# Patient Record
Sex: Male | Born: 1985 | Race: White | Hispanic: No | Marital: Single | Smoking: Never smoker
Health system: Southern US, Community
[De-identification: ages and names within clinical notes are randomized; demographics above are authoritative.]

## PROBLEM LIST (undated history)

## (undated) DIAGNOSIS — R0902 Hypoxemia: Secondary | ICD-10-CM

## (undated) DIAGNOSIS — G4730 Sleep apnea, unspecified: Secondary | ICD-10-CM

## (undated) DIAGNOSIS — F32A Depression, unspecified: Secondary | ICD-10-CM

## (undated) DIAGNOSIS — N289 Disorder of kidney and ureter, unspecified: Secondary | ICD-10-CM

## (undated) DIAGNOSIS — K219 Gastro-esophageal reflux disease without esophagitis: Secondary | ICD-10-CM

## (undated) DIAGNOSIS — T7840XA Allergy, unspecified, initial encounter: Secondary | ICD-10-CM

## (undated) DIAGNOSIS — F419 Anxiety disorder, unspecified: Secondary | ICD-10-CM

## (undated) HISTORY — DX: Hypoxemia: R09.02

## (undated) HISTORY — DX: Gastro-esophageal reflux disease without esophagitis: K21.9

## (undated) HISTORY — DX: Depression, unspecified: F32.A

## (undated) HISTORY — PX: HERNIA REPAIR: SHX51

## (undated) HISTORY — PX: TESTICLE REMOVAL: SHX68

## (undated) HISTORY — DX: Sleep apnea, unspecified: G47.30

## (undated) HISTORY — DX: Allergy, unspecified, initial encounter: T78.40XA

## (undated) HISTORY — DX: Anxiety disorder, unspecified: F41.9

---
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
## 2020-02-01 ENCOUNTER — Ambulatory Visit
Admission: RE | Admit: 2020-02-01 | Discharge: 2020-02-01 | Disposition: A | Discharge status: Home / Self Care | Payer: 59 | Source: Ambulatory Visit | Attending: Emergency Medicine | Admitting: Emergency Medicine

## 2020-02-01 ENCOUNTER — Other Ambulatory Visit: Payer: Self-pay

## 2020-02-01 VITALS — BP 149/106 | HR 107 | Temp 99.6°F | Resp 20

## 2020-02-01 DIAGNOSIS — R059 Cough, unspecified: Secondary | ICD-10-CM | POA: Insufficient documentation

## 2020-02-01 DIAGNOSIS — Z113 Encounter for screening for infections with a predominantly sexual mode of transmission: Secondary | ICD-10-CM | POA: Insufficient documentation

## 2020-02-01 DIAGNOSIS — Z20822 Contact with and (suspected) exposure to covid-19: Secondary | ICD-10-CM | POA: Diagnosis not present

## 2020-02-01 HISTORY — DX: Disorder of kidney and ureter, unspecified: N28.9

## 2020-02-01 MED ORDER — CETIRIZINE HCL 10 MG PO TABS: 10.0000 mg | ORAL_TABLET | Freq: Every day | ORAL | 0 refills | Status: DC

## 2020-02-01 MED ORDER — BENZONATATE 100 MG PO CAPS: 100.0000 mg | ORAL_CAPSULE | Freq: Three times a day (TID) | ORAL | 0 refills | Status: DC

## 2020-02-01 MED ORDER — FLUTICASONE PROPIONATE 50 MCG/ACT NA SUSP: 1.0000 | Freq: Every day | NASAL | 0 refills | Status: DC

## 2020-02-01 NOTE — ED Triage Notes (Signed)
Pt states had a positive covid at work last week and Monday night. States had a neg home covid test Saturday night and Monday night.  Pt c/o cough, chills, body aches, and headaches since Monday night.
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
## 2020-02-01 NOTE — Discharge Instructions (Signed)

## 2020-02-03 LAB — SARS-COV-2, NAA 2 DAY TAT

## 2020-02-03 LAB — NOVEL CORONAVIRUS, NAA: SARS-CoV-2, NAA: DETECTED — AB

## 2020-02-06 LAB — CYTOLOGY, (ORAL, ANAL, URETHRAL) ANCILLARY ONLY
Chlamydia: NEGATIVE
Comment: NEGATIVE
Comment: NEGATIVE
Comment: NORMAL
Neisseria Gonorrhea: NEGATIVE
Trichomonas: NEGATIVE

## 2020-02-10 ENCOUNTER — Ambulatory Visit (INDEPENDENT_AMBULATORY_CARE_PROVIDER_SITE_OTHER): Payer: 59

## 2020-02-10 ENCOUNTER — Ambulatory Visit
Admission: EM | Admit: 2020-02-10 | Discharge: 2020-02-10 | Disposition: A | Discharge status: Home / Self Care | Payer: 59 | Attending: Internal Medicine | Admitting: Internal Medicine

## 2020-02-10 DIAGNOSIS — J1282 Pneumonia due to coronavirus disease 2019: Secondary | ICD-10-CM

## 2020-02-10 DIAGNOSIS — U071 COVID-19: Secondary | ICD-10-CM

## 2020-02-10 MED ORDER — ALBUTEROL SULFATE HFA 108 (90 BASE) MCG/ACT IN AERS: 2.0000 | INHALATION_SPRAY | RESPIRATORY_TRACT | 0 refills | Status: DC | PRN

## 2020-02-10 MED ORDER — HYDROCOD POLST-CPM POLST ER 10-8 MG/5ML PO SUER: 5.0000 mL | Freq: Two times a day (BID) | ORAL | 0 refills | Status: DC

## 2020-02-10 MED ORDER — PREDNISONE 50 MG PO TABS: ORAL_TABLET | ORAL | 0 refills | Status: DC

## 2020-02-10 NOTE — ED Provider Notes (Signed)
Colin Graham    CSN: [id number] Arrival date & time: 02/10/20  1306      History   Chief Complaint Chief Complaint  Patient presents with  . Cough  . Fever    HPI Colin Graham is a 34 y.o. male who presents due to not beefing better and continuing to have fevers of 99-100, cough since diagnosed with covid 12/22. Symptoms started 12/20. Has felt SOB with activity and has intermittent fatigue. Has been taking Tylenol, tessalon and zyrtec. The tessalon is not helping his cough. He bough a pulse oxymetry, but has not used it yet.     Past Medical History:  Diagnosis Date  . Renal disorder    born with one kidney    There are no problems to display for this patient.   Past Surgical History:  Procedure Laterality Date  . TESTICLE REMOVAL         Home Medications    Prior to Admission medications   Medication Sig Start Date End Date Taking? Authorizing Provider  albuterol (VENTOLIN HFA) 108 (90 Base) MCG/ACT inhaler Inhale 2 puffs into the lungs every 4 (four) hours as needed for wheezing or shortness of breath. 02/10/20  Yes Rodriguez-Southworth, Nettie Elm, PA-C  chlorpheniramine-HYDROcodone (TUSSIONEX PENNKINETIC ER) 10-8 MG/5ML SUER Take 5 mLs by mouth 2 (two) times daily. 02/10/20  Yes Rodriguez-Southworth, Nettie Elm, PA-C  predniSONE (DELTASONE) 50 MG tablet One a day for lung inflammation 02/10/20  Yes Rodriguez-Southworth, Nettie Elm, PA-C  benzonatate (TESSALON) 100 MG capsule Take 1 capsule (100 mg total) by mouth every 8 (eight) hours. 02/01/20   Hall-Potvin, [place], PA-C  fluticasone (FLONASE) 50 MCG/ACT nasal spray Place 1 spray into both nostrils daily. 02/01/20   Hall-Potvin, [place], PA-C    Family History History reviewed. No pertinent family history.  Social History Social History   Tobacco Use  . Smoking status: Never Smoker  . Smokeless tobacco: Never Used  Substance Use Topics  . Alcohol use: Yes  . Drug use: Not Currently      Allergies   Erythromycin, Azithromycin, and Drug class [penicillins]   Review of Systems Review of Systems  Constitutional: Positive for chills, diaphoresis, fatigue and fever. Negative for activity change and appetite change.  HENT: Positive for congestion, postnasal drip and rhinorrhea. Negative for ear discharge, ear pain, sore throat and trouble swallowing.   Eyes: Negative for discharge.  Respiratory: Positive for cough and shortness of breath. Negative for chest tightness and wheezing.   Cardiovascular: Negative for chest pain.  Gastrointestinal: Positive for diarrhea. Negative for abdominal pain, nausea and vomiting.  Musculoskeletal: Positive for myalgias. Negative for gait problem, neck pain and neck stiffness.  Skin: Negative for rash.  Neurological: Positive for headaches.  Hematological: Negative for adenopathy.     Physical Exam Triage Vital Signs ED Triage Vitals  Enc Vitals Group     BP      Pulse      Resp      Temp      Temp src      SpO2      Weight      Height      Head Circumference      Peak Flow      Pain Score      Pain Loc      Pain Edu?      Excl. in GC?    No data found.  Updated Vital Signs BP 115/81 (BP Location: Left Arm)   Pulse (!) 114  Temp 99.2 F (37.3 C) (Oral)   Resp 16   Wt 215 lb (97.5 kg)   SpO2 95%   Visual Acuity Right Eye Distance:   Left Eye Distance:   Bilateral Distance:    Right Eye Near:   Left Eye Near:    Bilateral Near:     Physical Exam Physical Exam Vitals signs and nursing note reviewed.  Constitutional:      General: he is not in acute distress.    Appearance: Normal appearance. he is not ill-appearing, toxic-appearing or diaphoretic.  HENT:     Head: Normocephalic.     Right Ear: Tympanic membrane, ear canal and external ear normal.     Left Ear: Tympanic membrane, ear canal and external ear normal.     Nose: Nose normal.     Mouth/Throat:     Mouth: Mucous membranes are moist.   Eyes:     General: No scleral icterus.       Right eye: No discharge.        Left eye: No discharge.     Conjunctiva/sclera: Conjunctivae normal.  Neck:     Musculoskeletal: Neck supple. No neck rigidity.  Cardiovascular:     Rate and Rhythm: Normal rate and regular rhythm.     Heart sounds: No murmur.  Pulmonary:     Effort: Pulmonary effort is normal.     Breath sounds: diminished on lower bases with positive egophony bilaterally Abdominal:     General: Bowel sounds are normal. There is no distension.     Palpations: Abdomen is soft. There is no mass.     Tenderness: There is no abdominal tenderness. There is no guarding or rebound.     Hernia: No hernia is present.  Musculoskeletal: Normal range of motion.  Lymphadenopathy:     Cervical: No cervical adenopathy.  Skin:    General: Skin is warm and dry.     Coloration: Skin is not jaundiced.     Findings: No rash.  Neurological:     Mental Status: he is alert and oriented to person, place, and time.     Gait: Gait normal.  Psychiatric:        Mood and Affect: Mood normal.        Behavior: Behavior normal.        Thought Content: Thought content normal.        Judgment: Judgment normal.    UC Treatments / Results  Labs (all labs ordered are listed, but only abnormal results are displayed) Labs Reviewed - No data to display  EKG   Radiology DG Chest 2 View  Result Date: 02/10/2020 CLINICAL DATA:  COVID positive, cough, fever, dyspnea EXAM: CHEST - 2 VIEW COMPARISON:  None. FINDINGS: Normal heart size. Normal mediastinal contour. No pneumothorax. No pleural effusion. Moderate patchy opacities throughout the mid to lower lungs bilaterally. IMPRESSION: Moderate patchy opacities throughout the mid to lower lungs bilaterally, compatible with COVID-19 pneumonia. Electronically Signed   By: Delbert Phenix M.D.   On: 02/10/2020 14:57    Procedures Procedures (including critical care time)  Medications Ordered in  UC Medications - No data to display  Initial Impression / Assessment and Plan / UC Course  I have reviewed the triage vital signs and the nursing notes. Has bilateral covid pneumonia. I taught him to do chest percussions so his wife can do it on him. Advised to get a spirometer to exercise his lungs. I placed him on albuterol inhaler, prednisone and  tussionex for the cough. See instructions.  Final Clinical Impressions(s) / UC Diagnoses   Final diagnoses:  Pneumonia due to COVID-19 virus     Discharge Instructions     Take tussionex if the tessalon perless is not helping the cough, but dont take both. Get a spirometer to work on your lungs and help you get better.  You may take the following supplements to help your immune system be stronger to fight this viral infection Take Quarcetin 500 mg three times a day x 7 days with Zinc 50 mg ones a day x 7 days. The quarcetin is an antiviral and anti-inflammatory supplement which helps open the zinc channels in the cell to absorb Zinc. Zinc helps decrease the virus load in your body. Take Melatonin 6-10 mg at bed time which also helps support your immune system.  Also make sure to take Vit D 5,000 IU per day with a fatty meal and Vit C 5000 mg a day until you are completely better. To prevent viral illnesses your vitamin D should be between 60-80. Stay on Vitamin D 2,000  and C  1000 mg the rest of the season.  Don't lay around, keep active and walk as much as you are able to to prevent worsening of your symptoms.  Follow up with your family Dr next week.  If you get short of breath and you are able to check  your oxygen with a pulse oxygen meter, if it gets to 92% or less, you need to go to the hospital to be admitted. If you dont have one, come back here and we will assess you.      ED Prescriptions    Medication Sig Dispense Auth. Provider   predniSONE (DELTASONE) 50 MG tablet One a day for lung inflammation 5 tablet Rodriguez-Southworth,  Nettie Elm, PA-C   albuterol (VENTOLIN HFA) 108 (90 Base) MCG/ACT inhaler Inhale 2 puffs into the lungs every 4 (four) hours as needed for wheezing or shortness of breath. 18 g Rodriguez-Southworth, Caliann Leckrone, PA-C   chlorpheniramine-HYDROcodone (TUSSIONEX PENNKINETIC ER) 10-8 MG/5ML SUER Take 5 mLs by mouth 2 (two) times daily. 140 mL Rodriguez-Southworth, Nettie Elm, [place]     I have reviewed the PDMP during this encounter.   Garey Ham, PA-C 02/10/20 1722

## 2020-02-10 NOTE — ED Triage Notes (Signed)
Patient presents to Urgent Care with complaints of fever 99.9, coughing, body aches, chills x 12/20. Tested positive for covid 12/22. Seen at [facility]. Here for a follow-up reports symptoms not improving. Treating at home with Tylenol. Prescribed tessalon and zyrtec pt reports it did not provide relief.

## 2020-02-10 NOTE — Discharge Instructions (Addendum)
Take tussionex if the tessalon perless is not helping the cough, but dont take both. Get a spirometer to work on your lungs and help you get better.  You may take the following supplements to help your immune system be stronger to fight this viral infection Take Quarcetin 500 mg three times a day x 7 days with Zinc 50 mg ones a day x 7 days. The quarcetin is an antiviral and anti-inflammatory supplement which helps open the zinc channels in the cell to absorb Zinc. Zinc helps decrease the virus load in your body. Take Melatonin 6-10 mg at bed time which also helps support your immune system.  Also make sure to take Vit D 5,000 IU per day with a fatty meal and Vit C 5000 mg a day until you are completely better. To prevent viral illnesses your vitamin D should be between 60-80. Stay on Vitamin D 2,000  and C  1000 mg the rest of the season.  Don't lay around, keep active and walk as much as you are able to to prevent worsening of your symptoms.  Follow up with your family Dr next week.  If you get short of breath and you are able to check  your oxygen with a pulse oxygen meter, if it gets to 92% or less, you need to go to the hospital to be admitted. If you dont have one, come back here and we will assess you.

## 2021-11-06 ENCOUNTER — Ambulatory Visit
Admission: RE | Admit: 2021-11-06 | Discharge: 2021-11-06 | Disposition: A | Discharge status: Home / Self Care | Payer: 59 | Source: Ambulatory Visit | Attending: Emergency Medicine | Admitting: Emergency Medicine

## 2021-11-06 VITALS — BP 127/89 | HR 98 | Temp 98.6°F | Resp 18 | Ht 71.0 in

## 2021-11-06 DIAGNOSIS — J0100 Acute maxillary sinusitis, unspecified: Secondary | ICD-10-CM | POA: Diagnosis not present

## 2021-11-06 DIAGNOSIS — J209 Acute bronchitis, unspecified: Secondary | ICD-10-CM | POA: Diagnosis not present

## 2021-11-06 DIAGNOSIS — R509 Fever, unspecified: Secondary | ICD-10-CM | POA: Diagnosis not present

## 2021-11-06 MED ORDER — PROMETHAZINE-DM 6.25-15 MG/5ML PO SYRP: 5.0000 mL | ORAL_SOLUTION | Freq: Four times a day (QID) | ORAL | 0 refills | Status: DC | PRN

## 2021-11-06 MED ORDER — AEROCHAMBER MV MISC: 1 refills | Status: DC

## 2021-11-06 MED ORDER — ALBUTEROL SULFATE HFA 108 (90 BASE) MCG/ACT IN AERS: 1.0000 | INHALATION_SPRAY | RESPIRATORY_TRACT | 0 refills | Status: DC | PRN

## 2021-11-06 MED ORDER — FLUTICASONE PROPIONATE 50 MCG/ACT NA SUSP: 2.0000 | Freq: Every day | NASAL | 0 refills | Status: DC

## 2021-11-06 MED ORDER — DOXYCYCLINE HYCLATE 100 MG PO CAPS: 100.0000 mg | ORAL_CAPSULE | Freq: Two times a day (BID) | ORAL | 0 refills | Status: AC

## 2021-11-06 NOTE — Discharge Instructions (Addendum)
Start Mucinex-D to keep the mucous thin and to decongest you.  Flonase for the nasal congestion.  Use a NeilMed sinus rinse with distilled water as often as you want to to reduce nasal congestion. Follow the directions on the box.  Promethazine DM for the cough.  2 puffs from your albuterol inhaler using your spacer every 4 hours for 2 days, then every 6 hours for 2 days, then as needed.  You can back off on the albuterol if you start to improve sooner. Here is a list of primary care providers who are taking new patients:  [facility] [place] Dr. Rosette Reveal (sports medicine) Dr. Otilio Miu [address] [place] [address] [place] [facility] [place],  [place] [address] [place] [address]  [phone]  [facility] [address] [place], [place] [address] [phone]  [facility] [place]  [phone] [place], [place] [address]  Here are clinics/ other resources who will see you if you do not have insurance. Some have certain criteria that you must meet. Call them and find out what they are:  [facility]: [address] [place], [place] [address] Phone: [phone] Hours: First and Third Saturdays of each Month, 9 a.m. - 1 p.m.  [facility]: [address] [place], [place], [place] [address] Phone: [phone] Hours: Tuesday, 4 p.m. - 8 p.m. Thursday, 1 p.m. - 8 p.m. Wednesday, 9 a.m. - [facility] [address] [place], [place] [address] Phone: [phone] Pharmacy Phone Number: [phone] Dental Phone Number: [phone] [facility] Help: [phone]  Dental Hours: Monday - Thursday, 8 a.m. - 6 p.m.  [facility] [address], [place], [place] [address] Phone: [phone] Pharmacy Phone Number: [phone] [facility] Insurance Help:  [phone]  [facility] [address] [place], [place] [address] Phone: [phone] Pharmacy Phone Number: [phone] [facility] Insurance Help: [phone]  [facility] [address] [place], [place] [address] Phone: [phone] [facility] Insurance Help: [phone]   [address] [place],  [address] Phone: [phone]  Go to www.goodrx.com  or www.costplusdrugs.com to look up your medications. This will give you a list of where you can find your prescriptions at the most affordable prices. Or ask the pharmacist what the cash price is, or if they have any other discount programs available to help make your medication more affordable. This can be less expensive than what you would pay with insurance.

## 2021-11-06 NOTE — ED Provider Notes (Signed)
HPI  SUBJECTIVE:  Colin Graham is a 36 y.o. male who presents with sore throat starting 9 days ago, body aches, headaches, fevers Tmax 101.3, sinus pain or pressure, nasal congestion, rhinorrhea, upper dental pain, postnasal drip.  He reports a cough productive of greenish-yellow, occasionally blood-streaked sputum, shortness of breath with exertion.  He is unable to sleep at night secondary to the cough.  Your reports bilateral ear pressure, but no pain.  No wheezing, nausea, vomiting, diarrhea, abdominal pain.  He is unable to sleep at night secondary to the cough.  No antibiotics in the past month.  He took Tylenol within 6 hours of evaluation.  He has also been using his albuterol inhaler as needed, taking supplements and increasing his fluids.  Tylenol and albuterol help.  Symptoms are worse with lying down.  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  No history of pulmonary disease, smoking.  PCP: None.   Past Medical History:  Diagnosis Date   Renal disorder    born with one kidney    Past Surgical History:  Procedure Laterality Date   TESTICLE REMOVAL      History reviewed. No pertinent family history.  Social History   Tobacco Use   Smoking status: Never   Smokeless tobacco: Never  Substance Use Topics   Alcohol use: Never   Drug use: Not Currently    No current facility-administered medications for this encounter.  Current Outpatient Medications:    albuterol (VENTOLIN HFA) 108 (90 Base) MCG/ACT inhaler, Inhale 1-2 puffs into the lungs every 4 (four) hours as needed for wheezing or shortness of breath., Disp: 1 each, Rfl: 0   doxycycline (VIBRAMYCIN) 100 MG capsule, Take 1 capsule (100 mg total) by mouth 2 (two) times daily for 10 days., Disp: 20 capsule, Rfl: 0   fluticasone (FLONASE) 50 MCG/ACT nasal spray, Place 2 sprays into both nostrils daily., Disp: 16 g, Rfl: 0    promethazine-dextromethorphan (PROMETHAZINE-DM) 6.25-15 MG/5ML syrup, Take 5 mLs by mouth 4 (four) times daily as needed for cough., Disp: 118 mL, Rfl: 0   Spacer/Aero-Holding Chambers (AEROCHAMBER MV) inhaler, Use as instructed, Disp: 1 each, Rfl: 1  Allergies  Allergen Reactions   Erythromycin Anaphylaxis   Aspirin    Azithromycin    Drug Class [Penicillins]      ROS  As noted in HPI.   Physical Exam  BP 127/89   Pulse 98   Temp 98.6 F (37 C)   Resp 18   Ht 5\' 11"  (1.803 m)   SpO2 97%   BMI 29.99 kg/m   Constitutional: Well developed, well nourished, no acute distress Eyes:  EOMI, conjunctiva normal bilaterally HENT: Normocephalic, atraumatic,mucus membranes moist and erythematous, swollen turbinates.  Purulent nasal congestion.  Positive maxillary sinus tenderness.  No postnasal drip.  Normal oropharynx.  TMs normal bilaterally. Neck: No cervical lymphadenopathy. Respiratory: Normal inspiratory effort, lungs clear bilaterally.  No chest wall tenderness Cardiovascular: Normal rate, regular rhythm, no murmurs rubs or gallops GI: nondistended skin: No rash, skin intact Musculoskeletal: no deformities Neurologic: Alert & oriented x 3, no focal neuro deficits Psychiatric: Speech and behavior appropriate   ED Course   Medications - No data to display  No orders of the defined types were placed in this encounter.   No results found for this or any previous visit (from the past 24 hour(s)). No results found.  ED Clinical  Impression  1. Acute non-recurrent maxillary sinusitis   2. Fever, unspecified   3. Acute bronchitis, unspecified organism      ED Assessment/Plan     Deferring testing for strep, COVID and flu given duration of symptoms.  I suspect he has maxillary sinusitis from what ever he had in the beginning.  Sending home with doxycycline for 10 days for sinusitis.  Deferring chest x-ray as the doxycycline will cover any possible pneumonia.  Flonase,  regularly scheduled albuterol inhaler with a spacer, saline nasal irrigation, Mucinex D, Promethazine DM.  Will provide primary care list for ongoing care.  Discussed MDM, treatment plan, and plan for follow-up with patient. patient agrees with plan.   Meds ordered this encounter  Medications   doxycycline (VIBRAMYCIN) 100 MG capsule    Sig: Take 1 capsule (100 mg total) by mouth 2 (two) times daily for 10 days.    Dispense:  20 capsule    Refill:  0   fluticasone (FLONASE) 50 MCG/ACT nasal spray    Sig: Place 2 sprays into both nostrils daily.    Dispense:  16 g    Refill:  0   promethazine-dextromethorphan (PROMETHAZINE-DM) 6.25-15 MG/5ML syrup    Sig: Take 5 mLs by mouth 4 (four) times daily as needed for cough.    Dispense:  118 mL    Refill:  0   Spacer/Aero-Holding Chambers (AEROCHAMBER MV) inhaler    Sig: Use as instructed    Dispense:  1 each    Refill:  1   albuterol (VENTOLIN HFA) 108 (90 Base) MCG/ACT inhaler    Sig: Inhale 1-2 puffs into the lungs every 4 (four) hours as needed for wheezing or shortness of breath.    Dispense:  1 each    Refill:  0      *This clinic note was created using [employer]. Therefore, there may be occasional mistakes despite careful proofreading.  ?    Melynda Ripple, MD 11/07/21 0800

## 2021-11-06 NOTE — ED Triage Notes (Signed)
Patient to Urgent Care with complaints of cough, sore throat, congestion, bloody mucus, chills, headache, and body aches. Started experiencing congestion/ fevers yesterday. Temp as high as 101.3.  Denies any known sick contacts.

## 2021-11-21 ENCOUNTER — Ambulatory Visit
Admission: EM | Admit: 2021-11-21 | Discharge: 2021-11-21 | Disposition: A | Discharge status: Home / Self Care | Payer: 59 | Attending: Family Medicine | Admitting: Family Medicine

## 2021-11-21 ENCOUNTER — Encounter: Payer: Self-pay | Admitting: Emergency Medicine

## 2021-11-21 DIAGNOSIS — J069 Acute upper respiratory infection, unspecified: Secondary | ICD-10-CM

## 2021-11-21 MED ORDER — HYDROCODONE BIT-HOMATROP MBR 5-1.5 MG/5ML PO SOLN: 5.0000 mL | Freq: Four times a day (QID) | ORAL | 0 refills | Status: DC | PRN

## 2021-11-21 NOTE — ED Triage Notes (Signed)
Pt is present today with cough, body aches, and fever. Pt sx started Tuesday

## 2021-11-21 NOTE — ED Provider Notes (Signed)
[facility]   [id number] 11/21/21 Arrival Time: 1604  ASSESSMENT & PLAN:  1. Viral URI with cough    Discussed typical duration of viral illnesses. Viral testing declined. OTC symptom care as needed.  New Prescriptions   HYDROCODONE BIT-HOMATROPINE (HYCODAN) 5-1.5 MG/5ML SYRUP    Take 5 mLs by mouth every 6 (six) hours as needed for cough.     Follow-up Information     [facility] at [facility] .   Specialty: Urgent Care Why: If worsening or failing to improve as anticipated. Contact information: [address] [phone] mc [place] [address] [phone]                Reviewed expectations re: course of current medical issues. Questions answered. Outlined signs and symptoms indicating need for more acute intervention. Understanding verbalized. After Visit Summary given.   SUBJECTIVE: History from: Patient. Colin Graham is a 36 y.o. male. Reports: cough, chest congestion, subj fever/chills; x 1 day. Denies: difficulty breathing. Normal PO intake without n/v/d.  OBJECTIVE:  Vitals:   11/21/21 1704  BP: (!) 124/92  Pulse: (!) 109  Resp: 18  Temp: 98.8 F (37.1 C)  SpO2: 95%    Tachycardia noted. General appearance: alert; no distress Eyes: PERRLA; EOMI; conjunctiva normal HENT: [place]; AT; with nasal congestion Neck: supple  Lungs: speaks full sentences without difficulty; unlabored; dry cough; CTAB Extremities: no edema Skin: warm and dry Neurologic: normal gait Psychological: alert and cooperative; normal mood and affect  Labs: Results for orders placed or performed during the hospital encounter of 02/01/20  Novel Coronavirus, NAA ([facility])   Specimen: Nasopharyngeal Swab; Nasopharyngeal(NP) swabs in vial transport medium   Nasopharynge  Result Value Ref Range   SARS-CoV-2, NAA Detected (A) Not Detected  SARS-COV-2, NAA 2 DAY TAT   Nasopharynge  Result Value Ref Range   SARS-CoV-2,  NAA 2 DAY TAT Performed   Cytology (oral, anal, urethral) ancillary only  Result Value Ref Range   Neisseria Gonorrhea Negative    Chlamydia Negative    Trichomonas Negative    Comment Normal Reference Range Trichomonas - Negative    Comment Normal Reference [place] Chlamydia - Negative    Comment      Normal Reference Range Neisseria Gonorrhea - Negative   Labs Reviewed - No data to display  Imaging: No results found.  Allergies  Allergen Reactions   Erythromycin Anaphylaxis   Aspirin    Azithromycin    Drug Class [Penicillins]     Past Medical History:  Diagnosis Date   Renal disorder    born with one kidney   Social History   Socioeconomic History   Marital status: Single    Spouse name: Not on file   Number of children: Not on file   Years of education: Not on file   Highest education level: Not on file  Occupational History   Not on file  Tobacco Use   Smoking status: Never   Smokeless tobacco: Never  Substance and Sexual Activity   Alcohol use: Never   Drug use: Not Currently   Sexual activity: Yes  Other Topics Concern   Not on file  Social History Narrative   Not on file   Social Determinants of Health   Financial Resource Strain: Not on file  Food Insecurity: Not on file  Transportation Needs: Not on file  Physical Activity: Not on file  Stress: Not on file  Social Connections: Not on file  Intimate  Partner Violence: Not on file   History reviewed. No pertinent family history. Past Surgical History:  Procedure Laterality Date   TESTICLE REMOVAL       Vanessa Kick, MD 11/21/21 1736

## 2021-11-22 ENCOUNTER — Ambulatory Visit: Payer: Self-pay

## 2022-07-31 IMAGING — DX DG CHEST 2V
2 series · 2 of 2 positions shown · non-contrast
Comparison: None.

CLINICAL DATA: COVID positive, cough, fever, dyspnea

EXAM:
CHEST - 2 VIEW

[chest pa]
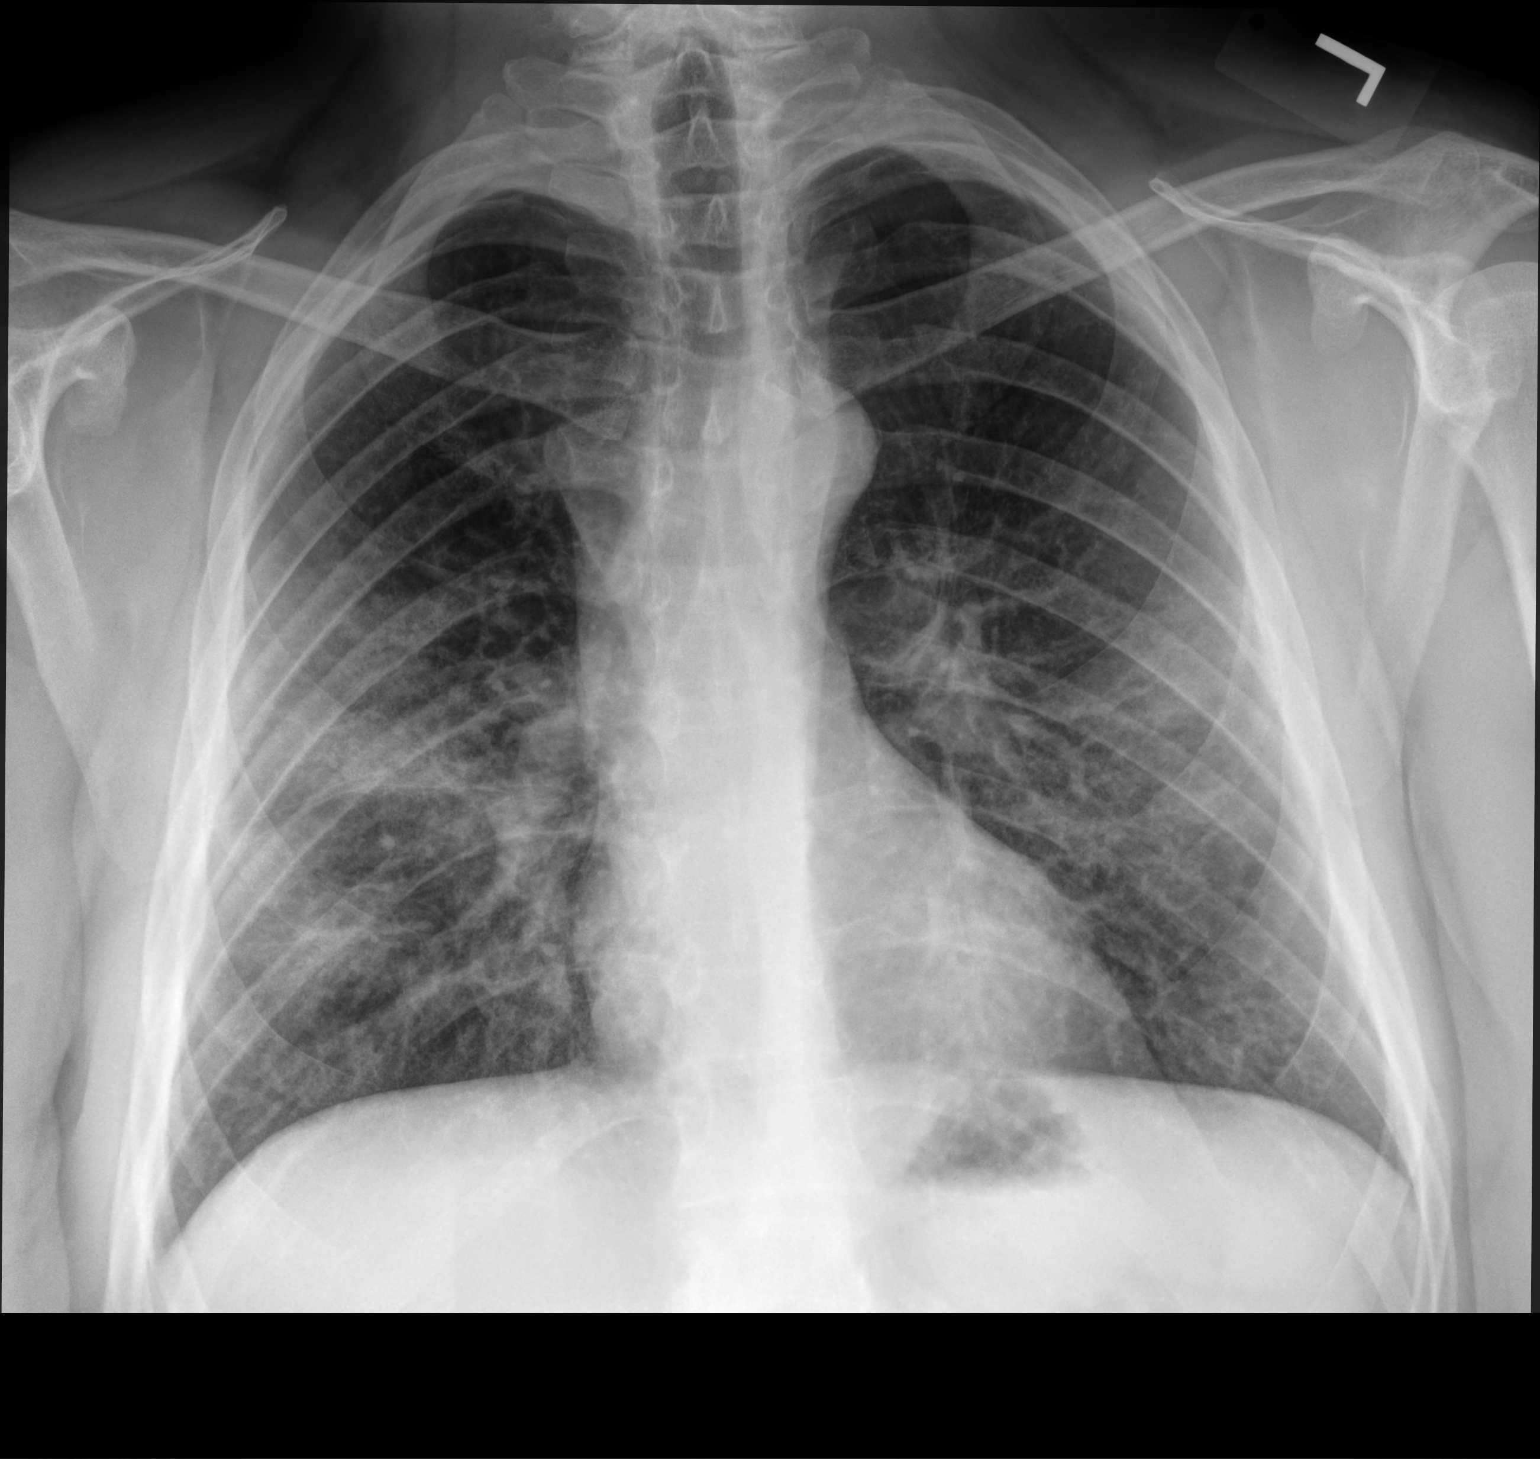

[chest lat]
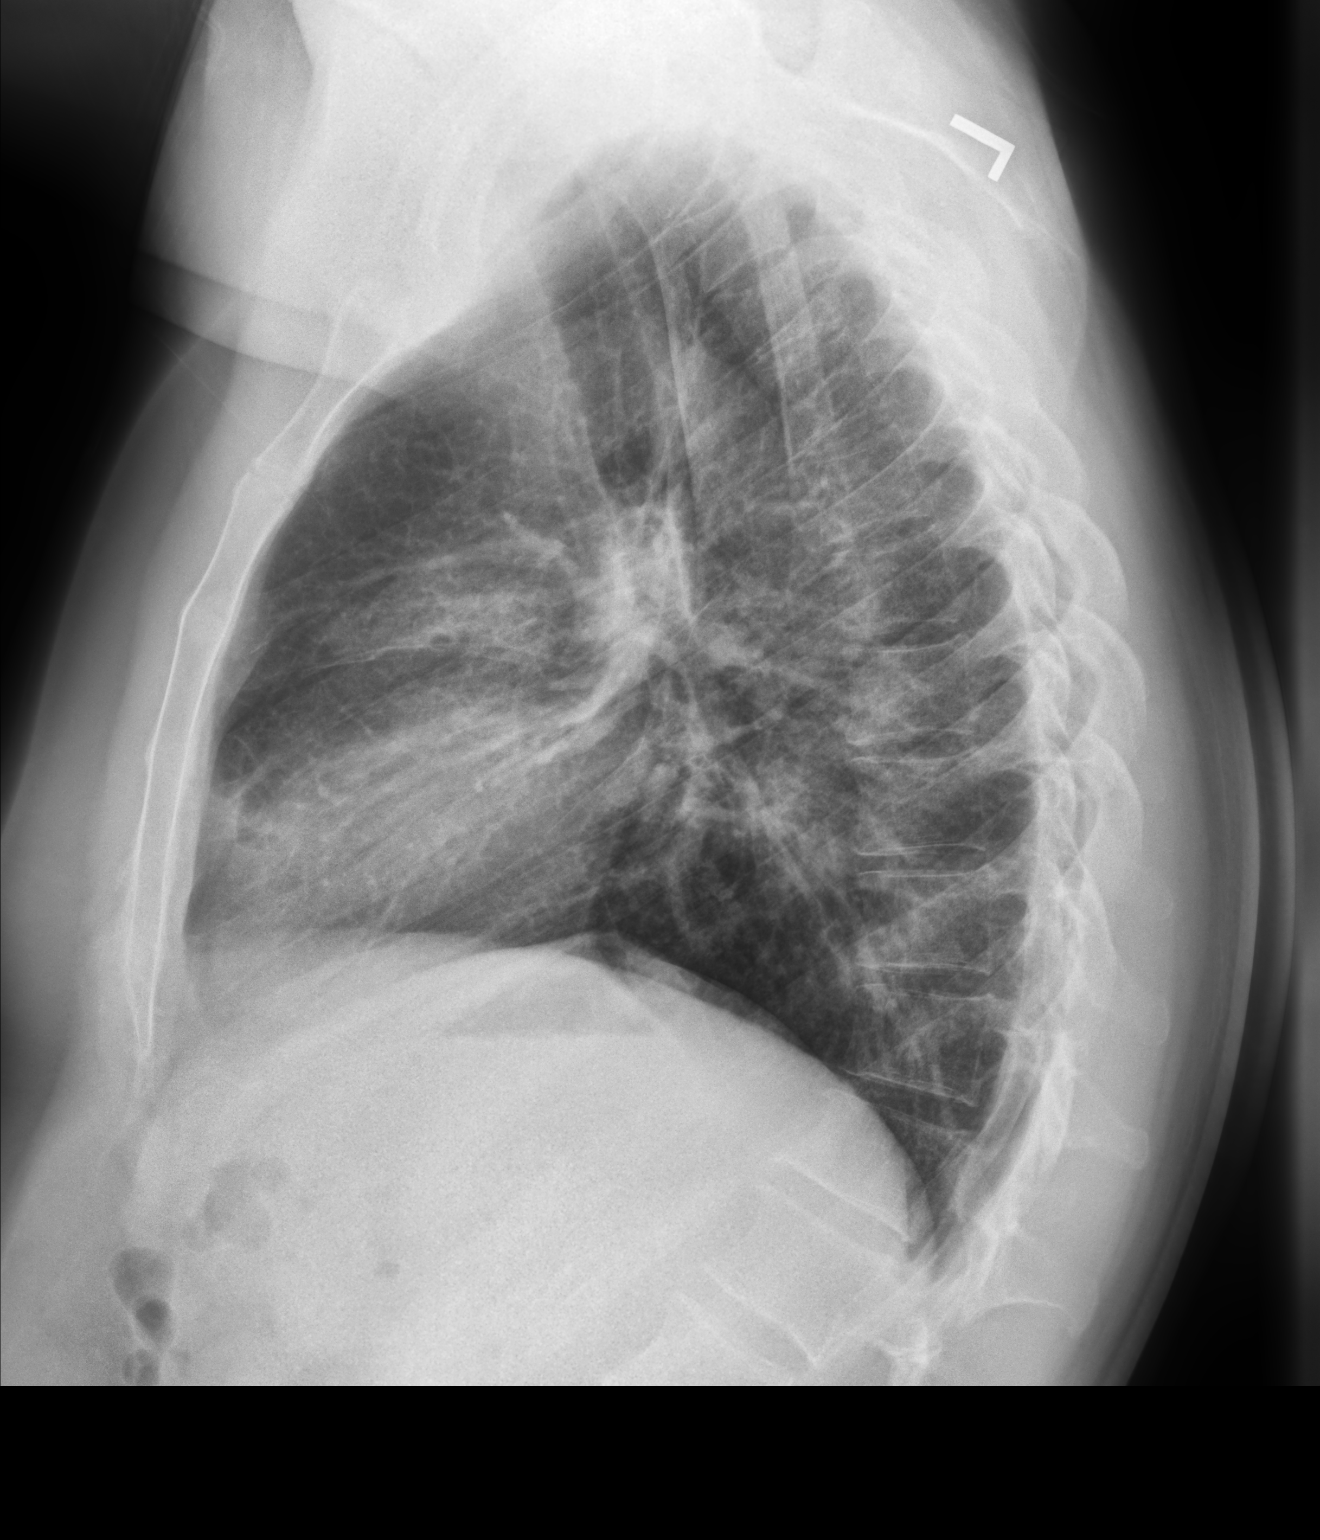

[2 of 2 positions shown; findings below may reference images not displayed]

FINDINGS: Normal heart size. Normal mediastinal contour. No pneumothorax. No
pleural effusion. Moderate patchy opacities throughout the mid to
lower lungs bilaterally.
IMPRESSION: Moderate patchy opacities throughout the mid to lower lungs
bilaterally, compatible with YE4YP-SD pneumonia.

## 2023-04-09 ENCOUNTER — Encounter: Payer: Self-pay | Admitting: Family Medicine

## 2023-04-09 ENCOUNTER — Ambulatory Visit (INDEPENDENT_AMBULATORY_CARE_PROVIDER_SITE_OTHER): Payer: 59 | Admitting: Family Medicine

## 2023-04-09 VITALS — BP 128/89 | HR 81 | Ht 71.0 in | Wt 240.0 lb

## 2023-04-09 DIAGNOSIS — Z114 Encounter for screening for human immunodeficiency virus [HIV]: Secondary | ICD-10-CM | POA: Diagnosis not present

## 2023-04-09 DIAGNOSIS — F419 Anxiety disorder, unspecified: Secondary | ICD-10-CM | POA: Diagnosis not present

## 2023-04-09 DIAGNOSIS — Z0000 Encounter for general adult medical examination without abnormal findings: Secondary | ICD-10-CM

## 2023-04-09 DIAGNOSIS — F32A Depression, unspecified: Secondary | ICD-10-CM | POA: Insufficient documentation

## 2023-04-09 DIAGNOSIS — Z1159 Encounter for screening for other viral diseases: Secondary | ICD-10-CM

## 2023-04-09 LAB — COMPREHENSIVE METABOLIC PANEL
ALT: 17 U/L (ref 0–53)
AST: 16 U/L (ref 0–37)
Albumin: 4.5 g/dL (ref 3.5–5.2)
Alkaline Phosphatase: 60 U/L (ref 39–117)
BUN: 20 mg/dL (ref 6–23)
CO2: 28 meq/L (ref 19–32)
Calcium: 9.3 mg/dL (ref 8.4–10.5)
Chloride: 103 meq/L (ref 96–112)
Creatinine, Ser: 1.28 mg/dL (ref 0.40–1.50)
GFR: 70.99 mL/min (ref >60.00)
Glucose, Bld: 97 mg/dL (ref 70–99)
Potassium: 4.2 meq/L (ref 3.5–5.1)
Sodium: 139 meq/L (ref 135–145)
Total Bilirubin: 0.6 mg/dL (ref 0.2–1.2)
Total Protein: 7.7 g/dL (ref 6.0–8.3)

## 2023-04-09 LAB — CBC WITH DIFFERENTIAL/PLATELET
Basophils Absolute: 0.1 10*3/uL (ref 0.0–0.1)
Basophils Relative: 1 % (ref 0.0–3.0)
Eosinophils Absolute: 0.3 10*3/uL (ref 0.0–0.7)
Eosinophils Relative: 5.1 % — ABNORMAL HIGH (ref 0.0–5.0)
HCT: 42 % (ref 39.0–52.0)
Hemoglobin: 14.1 g/dL (ref 13.0–17.0)
Lymphocytes Relative: 24.1 % (ref 12.0–46.0)
Lymphs Abs: 1.6 10*3/uL (ref 0.7–4.0)
MCHC: 33.6 g/dL (ref 30.0–36.0)
MCV: 86.3 fl (ref 78.0–100.0)
Monocytes Absolute: 0.6 10*3/uL (ref 0.1–1.0)
Monocytes Relative: 8.8 % (ref 3.0–12.0)
Neutro Abs: 4 10*3/uL (ref 1.4–7.7)
Neutrophils Relative %: 61 % (ref 43.0–77.0)
Platelets: 273 10*3/uL (ref 150.0–400.0)
RBC: 4.86 Mil/uL (ref 4.22–5.81)
RDW: 13.2 % (ref 11.5–15.5)
WBC: 6.5 10*3/uL (ref 4.0–10.5)

## 2023-04-09 LAB — LIPID PANEL
Cholesterol: 156 mg/dL (ref 0–200)
HDL: 61.3 mg/dL (ref >39.00)
LDL Cholesterol: 80 mg/dL (ref 0–99)
NonHDL: 94.72
Total CHOL/HDL Ratio: 3
Triglycerides: 72 mg/dL (ref 0.0–149.0)
VLDL: 14.4 mg/dL (ref 0.0–40.0)

## 2023-04-09 LAB — TSH: TSH: 2.87 u[IU]/mL (ref 0.35–5.50)

## 2023-04-09 NOTE — Progress Notes (Signed)
 Complete physical exam  Patient: Colin Graham   DOB: 03/26/1985   38 y.o. Male  MRN: [id number]  Subjective:    Chief Complaint  Patient presents with   Establish Care   He is here to establish care. He works in [employer].   Colin Graham is a 38 y.o. male who presents today for a complete physical exam. He reports consuming a general diet. Home exercise routine includes walking, body weight training. He generally feels fairly well. He reports sleeping fairly well. He does not have additional problems to discuss today.   Currently lives with: girlfriend Acute concerns or interim problems since last visit: none  Vision concerns: no Dental concerns: no STD concerns: no  ETOH use: rare Nicotine use: former Recreational drugs/illegal substances: no     Most recent fall risk assessment:    04/09/2023    8:56 AM  Fall Risk   Falls in the past year? 0  Number falls in past yr: 0  Injury with Fall? 0  Risk for fall due to : No Fall Risks  Follow up Falls evaluation completed     Most recent depression screenings:    04/09/2023    8:56 AM  PHQ 2/9 Scores  PHQ - 2 Score 4  PHQ- 9 Score 18      04/09/2023    8:57 AM  GAD 7 : Generalized Anxiety Score  Nervous, Anxious, on Edge 2  Control/stop worrying 3  Worry too much - different things 3  Trouble relaxing 2  Restless 2  Easily annoyed or irritable 3  Afraid - awful might happen 1  Total GAD 7 Score 16  Anxiety Difficulty Somewhat difficult             Patient Care Team: Clayborne Dana, NP as PCP - General (Family Medicine)   Outpatient Medications Prior to Visit  Medication Sig   [DISCONTINUED] albuterol (VENTOLIN HFA) 108 (90 Base) MCG/ACT inhaler Inhale 1-2 puffs into the lungs every 4 (four) hours as needed for wheezing or shortness of breath.   [DISCONTINUED] fluticasone (FLONASE) 50 MCG/ACT nasal spray Place 2 sprays into both nostrils daily.   [DISCONTINUED] HYDROcodone bit-homatropine (HYCODAN)  5-1.5 MG/5ML syrup Take 5 mLs by mouth every 6 (six) hours as needed for cough.   [DISCONTINUED] Spacer/Aero-Holding Chambers (AEROCHAMBER MV) inhaler Use as instructed   No facility-administered medications prior to visit.    ROS  All review of systems negative except what is listed in the HPI       Objective:     BP 128/89   Pulse 81   Ht 5\' 11"  (1.803 m)   Wt 240 lb (108.9 kg)   SpO2 99%   BMI 33.47 kg/m    Physical Exam Vitals reviewed.  Constitutional:      General: He is not in acute distress.    Appearance: Normal appearance. He is obese. He is not ill-appearing.  HENT:     Head: Normocephalic and atraumatic.     Right Ear: Tympanic membrane normal.     Left Ear: Tympanic membrane normal.     Nose: Nose normal.     Mouth/Throat:     Mouth: Mucous membranes are moist.     Pharynx: Oropharynx is clear.  Eyes:     Extraocular Movements: Extraocular movements intact.     Conjunctiva/sclera: Conjunctivae normal.     Pupils: Pupils are equal, round, and reactive to light.  Neck:     Vascular: No carotid bruit.  Cardiovascular:     Rate and Rhythm: Normal rate and regular rhythm.     Pulses: Normal pulses.     Heart sounds: Normal heart sounds.  Pulmonary:     Effort: Pulmonary effort is normal.     Breath sounds: Normal breath sounds.  Abdominal:     General: Abdomen is flat. Bowel sounds are normal. There is no distension.     Palpations: Abdomen is soft. There is no mass.     Tenderness: There is no abdominal tenderness. There is no right CVA tenderness, left CVA tenderness, guarding or rebound.  Genitourinary:    Comments: Deferred exam Musculoskeletal:        General: Normal range of motion.     Cervical back: Normal range of motion and neck supple. No tenderness.     Right lower leg: No edema.     Left lower leg: No edema.  Lymphadenopathy:     Cervical: No cervical adenopathy.  Skin:    General: Skin is warm and dry.     Capillary Refill:  Capillary refill takes less than 2 seconds.  Neurological:     General: No focal deficit present.     Mental Status: He is alert and oriented to person, place, and time. Mental status is at baseline.  Psychiatric:        Mood and Affect: Mood normal.        Behavior: Behavior normal.        Thought Content: Thought content normal.        Judgment: Judgment normal.         No results found for any visits on 04/09/23.     Assessment & Plan:    Routine Health Maintenance and Physical Exam Discussed health promotion and safety including diet and exercise recommendations, dental health, and injury prevention. Tobacco cessation if applicable. Seat belts, sunscreen, smoke detectors, etc.    Immunization History  Administered Date(s) Administered   Hepatitis B, PED/ADOLESCENT 08/09/1991, 09/13/1993, 12/08/1995   PFIZER(Purple Top)SARS-COV-2 Vaccination 03/08/2020, 03/29/2020    Health Maintenance  Topic Date Due   HIV Screening  Never done   Hepatitis C Screening  Never done   DTaP/Tdap/Td (1 - Tdap) Never done   COVID-19 Vaccine (3 - 2024-25 season) 04/25/2023 (Originally 10/12/2022)   INFLUENZA VACCINE  05/11/2023 (Originally 09/11/2022)   HPV VACCINES  Aged Out        Problem List Items Addressed This Visit       Active Problems   Anxiety and depression   Reports feeling "pretty depressed" but no current thoughts of self-harm. PHQ9/GAD7 discussed. -Monitor patient's mood. -Consider referral for counseling or initiation of antidepressant medication if symptoms persist or worsen.      Other Visit Diagnoses       Encounter for medical examination to establish care    -  Primary     Preventative health care       Relevant Orders   CBC with Differential/Platelet   Comprehensive metabolic panel   Lipid panel   TSH     Encounter for hepatitis C screening test for low risk patient       Relevant Orders   Hepatitis C antibody     Encounter for screening for HIV        Relevant Orders   HIV Antibody (routine testing w rflx)        PATIENT COUNSELING:    Sexuality: Discussed sexually transmitted diseases, partner selection, use of condoms, avoidance of  unintended pregnancy, and contraceptive alternatives.   I discussed with the patient that most people either abstain from alcohol or drink within safe limits (<=14/week and <=4 drinks/occasion for males, <=7/weeks and <= 3 drinks/occasion for females) and that the risk for alcohol disorders and other health effects rises proportionally with the number of drinks per week and how often a drinker exceeds daily limits.  Discussed cessation/primary prevention of drug use and availability of treatment for abuse.   Diet: Encouraged to adjust caloric intake to maintain or achieve ideal body weight, to reduce intake of dietary saturated fat and total fat, to limit sodium intake by avoiding high sodium foods and not adding table salt, and to maintain adequate dietary potassium and calcium preferably from fresh fruits, vegetables, and low-fat dairy products. Encouraged vitamin D 1000 units and Calcium 1300mg  or 4 servings of dairy a day.  Emphasized the importance of regular exercise.  Injury prevention: Discussed safety belts, safety helmets, smoke detector, smoking near bedding or upholstery.   Dental health: Discussed importance of regular tooth brushing, flossing, and dental visits.       Return in about 1 year (around 04/08/2024) for physical.     Clayborne Dana, NP

## 2023-04-09 NOTE — Assessment & Plan Note (Signed)
 Reports feeling "pretty depressed" but no current thoughts of self-harm. PHQ9/GAD7 discussed. -Monitor patient's mood. -Consider referral for counseling or initiation of antidepressant medication if symptoms persist or worsen.

## 2023-04-09 NOTE — Patient Instructions (Signed)

## 2023-04-10 ENCOUNTER — Encounter: Payer: Self-pay | Admitting: Family Medicine

## 2023-04-10 LAB — HEPATITIS C ANTIBODY: Hepatitis C Ab: NONREACTIVE

## 2023-04-10 LAB — HIV ANTIBODY (ROUTINE TESTING W REFLEX): HIV 1&2 Ab, 4th Generation: NONREACTIVE
